# Patient Record
Sex: Female | Born: 1950 | Race: White | Hispanic: No | Marital: Married | Smoking: Never smoker
Health system: Southern US, Community
[De-identification: ages and names within clinical notes are randomized; demographics above are authoritative.]

## PROBLEM LIST (undated history)

## (undated) HISTORY — PX: AUGMENTATION MAMMAPLASTY: SUR837

---

## 2006-11-03 ENCOUNTER — Ambulatory Visit (HOSPITAL_COMMUNITY): Admission: RE | Admit: 2006-11-03 | Discharge: 2006-11-03 | Payer: Self-pay | Admitting: Unknown Physician Specialty

## 2007-10-28 ENCOUNTER — Ambulatory Visit: Payer: Self-pay | Admitting: Family Medicine

## 2007-10-28 DIAGNOSIS — M25559 Pain in unspecified hip: Secondary | ICD-10-CM | POA: Insufficient documentation

## 2007-11-16 ENCOUNTER — Ambulatory Visit: Payer: Self-pay | Admitting: Sports Medicine

## 2007-11-16 DIAGNOSIS — IMO0001 Reserved for inherently not codable concepts without codable children: Secondary | ICD-10-CM | POA: Insufficient documentation

## 2007-11-16 DIAGNOSIS — M6281 Muscle weakness (generalized): Secondary | ICD-10-CM | POA: Insufficient documentation

## 2007-11-16 LAB — CONVERTED CEMR LAB
AST: 20 units/L (ref 0–37)
Alkaline Phosphatase: 73 units/L (ref 39–117)
Anti Nuclear Antibody(ANA): NEGATIVE
BUN: 13 mg/dL (ref 6–23)
Creatinine, Ser: 0.65 mg/dL (ref 0.40–1.20)
Cyclic Citrullin Peptide Ab: 0.2 units (ref <7)
HCT: 37.8 % (ref 36.0–46.0)
MCV: 90.2 fL (ref 78.0–100.0)
Platelets: 258 10*3/uL (ref 150–400)
RDW: 12.8 % (ref 11.5–15.5)
Rhuematoid fact SerPl-aCnc: <20 intl units/mL (ref 0–20)
Total Bilirubin: 0.4 mg/dL (ref 0.3–1.2)

## 2007-11-30 ENCOUNTER — Telehealth: Payer: Self-pay | Admitting: *Deleted

## 2007-12-02 ENCOUNTER — Ambulatory Visit (HOSPITAL_COMMUNITY): Admission: RE | Admit: 2007-12-02 | Discharge: 2007-12-02 | Payer: Self-pay | Admitting: Unknown Physician Specialty

## 2008-12-12 ENCOUNTER — Ambulatory Visit (HOSPITAL_COMMUNITY): Admission: RE | Admit: 2008-12-12 | Discharge: 2008-12-12 | Payer: Self-pay | Admitting: Unknown Physician Specialty

## 2009-05-29 ENCOUNTER — Ambulatory Visit: Payer: Self-pay | Admitting: Vascular Surgery

## 2009-08-23 ENCOUNTER — Ambulatory Visit: Payer: Self-pay | Admitting: Vascular Surgery

## 2009-10-30 ENCOUNTER — Ambulatory Visit: Payer: Self-pay | Admitting: Vascular Surgery

## 2009-11-22 ENCOUNTER — Ambulatory Visit: Payer: Self-pay | Admitting: Vascular Surgery

## 2011-01-04 ENCOUNTER — Encounter: Payer: Self-pay | Admitting: Unknown Physician Specialty

## 2011-04-28 NOTE — Assessment & Plan Note (Signed)
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  This was on 10/30/09.  She has some very mild bruising, and  this is now completely resolved.  Her thrombectomy sites are difficult  to see, and she reports an improvement in the discomfort that she had  had.  There was an area of some clot in the tunnel under the skin on her  medial knee, and this appears to be resolving as well.   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  I could access  this but could not get a guidewire to pass secondary to spasm.   On imaging this with a hand-held SonoSite, she does continue to have  flow in the vein of Jacomini.   I discussed this with the patient.  I suspect that this will not be a  significant difficulty to her, but I explained that it may increase her  possibility for recurrence.  She will notify us should she develop any  new difficulty.   Larina Earthly, M.D.  Electronically Signed   TFE/MEDQ  D:  11/22/2009  T:  11/25/2009  Job:  [id number]

## 2011-04-28 NOTE — Assessment & Plan Note (Signed)
OFFICE VISIT   MARLET, KORTE  DOB:  02/20/1951                                       10/30/2009  CHART#:[id number]   The patient presents today for treatment of her left leg varicosities.  I had initially seen her in June of 2010.  She had progressive  discomfort over her left posterior thigh.  Ultrasound revealed a vein of  Giacomini in the posterior aspect of her thigh.  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  Today she  presented for the procedure.  She did have a proximally 7 cm segment of  Giacomini vein leading into her popliteal vein.  I accessed this but the  vein spasmed and I was unable to get a wire through this.  I elected not  to perform a cutdown on this small vein.  She did undergo what I  initially recommended which was stab phlebectomy of the tributary  varicosities in her posterior thigh, calf and pretibial area.  She did  quite well with this, was placed in a compression garment and will see  [place] again in 2-3 weeks for followup.  I discussed the inability to treat  her vein with laser and she understands.   Larina Earthly, M.D.  Electronically Signed   TFE/MEDQ  D:  10/30/2009  T:  10/31/2009  Job:  [id number]

## 2011-04-28 NOTE — Assessment & Plan Note (Signed)
OFFICE VISIT   Tami Diaz, Tami Diaz  DOB:  05-09-1951                                       08/23/2009  CHART#:[id number]   The patient presents today for continued followup of her left leg venous  pathology.  She is a very pleasant, active 60 year old female with  marked varicosities in her left leg.  These begin at her left posterior  thigh, extend through her popliteal space and on to her calf.  She has  worn compression garments for 3 months now and reports that she has not  had improvement in her symptoms.  She has discontinued running as  exercise due to leg pain.  She works as a Armed forces operational officer and has  periods of prolonged standing and sitting as part of her job and it is  difficult due to leg pain and also has grandchildren that she regularly  keeps and has to curtail physical activity with them secondary to leg  pain.   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  This is refluxing into the  area of tributary saphenous veins.  These are enlarged throughout her  posterior thigh, popliteal space and on to her calf.  This does not  appear to have any involvement with her small saphenous or great  saphenous vein.   I feel that her best treatment option would be laser ablation of the  vein of Giacomini.  It is straight enough and it not too distended for  ablation and then stab phlebectomy of her multiple tributary  varicosities extending from her posterior thigh down to the level of her  ankle.  She understands this and we will proceed once we have assured,  once we have obtained insurance coverage.   Larina Earthly, M.D.  Electronically Signed   TFE/MEDQ  D:  08/23/2009  T:  08/23/2009  Job:  [id number]   cc:   Doreen Beam, MD

## 2011-04-28 NOTE — Consult Note (Signed)
NEW PATIENT CONSULTATION   Tami Diaz, Tami Diaz  DOB:  [birth year]                                       05/29/2009  CHART#:[id number]   The patient presents today for evaluation of left leg venous pathology.  She is a very pleasant 60 year old white female with a progressive  enlargement and discomfort over veins around her left knee.  She reports  that these are worse with prolonged standing and does have sensation of  heat and irritation over these areas.  This is worse with prolonged  standing.  She does elevate her legs when possible.  She has not been  wearing compression garments recently.  She does have some swelling in  her calf as well.   PAST MEDICAL HISTORY:  Significant for multiple extensive sclerotherapy  sessions for large amounts of varicosities in the same region in 2001 at  an outlying vein center.  She eventually had closure of the veins in  question.  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  She did not have  any history of deep venous thrombosis.  Interestingly she was placed on  Coumadin around the time of the sclerotherapy.  She does not have any  history of deep venous thrombosis.   FAMILY HISTORY:  Significant for multiple members with venous  varicosities and thrombophlebitis.   SOCIAL HISTORY:  She is married, three children.  She works as a Dentist.  She does not smoke.  She does have one glass of wine several  days a week.   REVIEW OF SYSTEMS:  Her weight is reported at 130 pounds.  She is 5 feet  6 inches tall.  She has no cardiac, pulmonary, GI or GU symptoms.  She  does have arthritis.   ALLERGIES:  She has no known drug allergies.   CURRENT MEDICATIONS:  Prometrium and Estrasorb to help with osteopenia.   PHYSICAL EXAMINATION:  General:  A well-developed, well-nourished white  female who appears her stated age of 2.  Vital signs:  Blood pressure  is 100/72, pulse 52, respirations 18.  Her radial pulses are 2+.  Her  dorsalis pedis pulses are 2+ bilaterally.  She does not have any venous  varicosities in her right leg.  In the left leg she does have  varicosities beginning above her popliteal fossa extending down towards  her popliteal fossa and then on the medial aspect down to her pretibial  area.  She does not have any severe swelling currently.   She underwent venous duplex today and this does show vein of Giacomini  in the posterior calf and multiple tributary vessels arising off of  this.  I discussed options with the patient.  She has not worn  compression garments recently and we have fitted her today with 20-30  mmHg thigh high compression garments.  I instructed her on the use of  these.  I also explained that with no incompetence in her saphenous vein  I would not recommend any laser ablation consideration since this is not  the etiology of her reticular veins.  I have explained the option of  stab phlebectomy of these tributary varicosities in the outpatient  setting for relief of her symptoms should conservative treatment fail.  She will see me again in 3  months for continued discussion of this.   Larina Earthly, M.D.  Electronically Signed   TFE/MEDQ  D:  05/29/2009  T:  05/30/2009  Job:  [id number]

## 2011-04-28 NOTE — Procedures (Signed)
LOWER EXTREMITY VENOUS REFLUX EXAM   INDICATION:  Bulging veins in the left lower extremity.   EXAM:  Using color-flow imaging and pulse Doppler spectral analysis, the  left common femoral, superficial femoral, popliteal, posterior tibial,  greater and lesser saphenous veins were evaluated.  There is no evidence  suggesting deep venous insufficiency in the left common femoral vein.   The left saphenofemoral junction is competent.  The left GSV is not  competent with the caliber as described below.   The left proximal short saphenous vein demonstrates incompetency with  diameter measurements ranging from 0.2 to 0.63 cm.   GSV Diameter (used if found to be incompetent only)                                            Right    Left  Proximal Greater Saphenous Vein           cm       0.54 cm  Proximal-to-mid-thigh                     cm       0.44 cm  Mid thigh                                 cm       0.46 cm  Mid-distal thigh                          cm       0.39 cm  Distal thigh                              cm       0.39 cm  Knee                                      cm       0.31 cm   IMPRESSION:  1. Left greater saphenous vein reflux is identified with the caliber      measurements as described above.  2. The left greater saphenous vein is not aneurysmal or tortuous.  3. The left common femoral vein is not competent.  4. The left proximal lesser saphenous vein is not competent.   ___________________________________________  Larina Earthly, M.D.   CH/MEDQ  D:  05/29/2009  T:  05/29/2009  Job:  [id number]

## 2012-06-27 ENCOUNTER — Other Ambulatory Visit: Payer: Self-pay | Admitting: Plastic Surgery

## 2012-06-27 DIAGNOSIS — Z1231 Encounter for screening mammogram for malignant neoplasm of breast: Secondary | ICD-10-CM

## 2012-08-31 ENCOUNTER — Ambulatory Visit
Admission: RE | Admit: 2012-08-31 | Discharge: 2012-08-31 | Disposition: A | Discharge status: Home / Self Care | Payer: BC Managed Care – PPO | Source: Ambulatory Visit | Attending: Plastic Surgery | Admitting: Plastic Surgery

## 2012-08-31 DIAGNOSIS — Z1231 Encounter for screening mammogram for malignant neoplasm of breast: Secondary | ICD-10-CM

## 2013-09-22 ENCOUNTER — Other Ambulatory Visit: Payer: Self-pay

## 2013-09-22 DIAGNOSIS — Z1231 Encounter for screening mammogram for malignant neoplasm of breast: Secondary | ICD-10-CM

## 2013-12-28 ENCOUNTER — Ambulatory Visit
Admission: RE | Admit: 2013-12-28 | Discharge: 2013-12-28 | Disposition: A | Discharge status: Home / Self Care | Payer: BC Managed Care – PPO | Source: Ambulatory Visit

## 2013-12-28 DIAGNOSIS — Z1231 Encounter for screening mammogram for malignant neoplasm of breast: Secondary | ICD-10-CM

## 2016-01-14 ENCOUNTER — Other Ambulatory Visit: Payer: Self-pay

## 2016-01-14 DIAGNOSIS — Z1231 Encounter for screening mammogram for malignant neoplasm of breast: Secondary | ICD-10-CM

## 2016-01-31 ENCOUNTER — Ambulatory Visit: Payer: Self-pay

## 2016-02-14 ENCOUNTER — Ambulatory Visit
Admission: RE | Admit: 2016-02-14 | Discharge: 2016-02-14 | Disposition: A | Discharge status: Home / Self Care | Payer: BLUE CROSS/BLUE SHIELD | Source: Ambulatory Visit

## 2016-02-14 ENCOUNTER — Ambulatory Visit: Payer: Self-pay

## 2016-02-14 DIAGNOSIS — Z1231 Encounter for screening mammogram for malignant neoplasm of breast: Secondary | ICD-10-CM

## 2017-09-14 DIAGNOSIS — Z23 Encounter for immunization: Secondary | ICD-10-CM | POA: Diagnosis not present

## 2017-10-04 DIAGNOSIS — Z0000 Encounter for general adult medical examination without abnormal findings: Secondary | ICD-10-CM | POA: Diagnosis not present

## 2017-10-04 DIAGNOSIS — M8580 Other specified disorders of bone density and structure, unspecified site: Secondary | ICD-10-CM | POA: Diagnosis not present

## 2017-10-04 DIAGNOSIS — R5383 Other fatigue: Secondary | ICD-10-CM | POA: Diagnosis not present

## 2017-10-04 DIAGNOSIS — E2839 Other primary ovarian failure: Secondary | ICD-10-CM | POA: Diagnosis not present

## 2017-10-04 DIAGNOSIS — E559 Vitamin D deficiency, unspecified: Secondary | ICD-10-CM | POA: Diagnosis not present

## 2017-10-04 DIAGNOSIS — I8312 Varicose veins of left lower extremity with inflammation: Secondary | ICD-10-CM | POA: Diagnosis not present

## 2017-10-08 DIAGNOSIS — Z23 Encounter for immunization: Secondary | ICD-10-CM | POA: Diagnosis not present

## 2017-10-08 DIAGNOSIS — E559 Vitamin D deficiency, unspecified: Secondary | ICD-10-CM | POA: Diagnosis not present

## 2017-10-08 DIAGNOSIS — M19042 Primary osteoarthritis, left hand: Secondary | ICD-10-CM | POA: Diagnosis not present

## 2017-10-08 DIAGNOSIS — Z6821 Body mass index (BMI) 21.0-21.9, adult: Secondary | ICD-10-CM | POA: Diagnosis not present

## 2017-10-08 DIAGNOSIS — M8580 Other specified disorders of bone density and structure, unspecified site: Secondary | ICD-10-CM | POA: Diagnosis not present

## 2017-10-08 DIAGNOSIS — R001 Bradycardia, unspecified: Secondary | ICD-10-CM | POA: Diagnosis not present

## 2017-10-08 DIAGNOSIS — Z0000 Encounter for general adult medical examination without abnormal findings: Secondary | ICD-10-CM | POA: Diagnosis not present

## 2017-10-08 DIAGNOSIS — M19041 Primary osteoarthritis, right hand: Secondary | ICD-10-CM | POA: Diagnosis not present

## 2017-10-28 DIAGNOSIS — H2513 Age-related nuclear cataract, bilateral: Secondary | ICD-10-CM | POA: Diagnosis not present

## 2017-10-28 DIAGNOSIS — H00022 Hordeolum internum right lower eyelid: Secondary | ICD-10-CM | POA: Diagnosis not present

## 2017-10-28 DIAGNOSIS — H43313 Vitreous membranes and strands, bilateral: Secondary | ICD-10-CM | POA: Diagnosis not present

## 2017-11-11 DIAGNOSIS — Z780 Asymptomatic menopausal state: Secondary | ICD-10-CM | POA: Diagnosis not present

## 2017-11-11 DIAGNOSIS — M8589 Other specified disorders of bone density and structure, multiple sites: Secondary | ICD-10-CM | POA: Diagnosis not present

## 2017-11-11 DIAGNOSIS — M810 Age-related osteoporosis without current pathological fracture: Secondary | ICD-10-CM | POA: Diagnosis not present

## 2017-11-12 DIAGNOSIS — L570 Actinic keratosis: Secondary | ICD-10-CM | POA: Diagnosis not present

## 2017-11-12 DIAGNOSIS — D229 Melanocytic nevi, unspecified: Secondary | ICD-10-CM | POA: Diagnosis not present

## 2017-11-12 DIAGNOSIS — L821 Other seborrheic keratosis: Secondary | ICD-10-CM | POA: Diagnosis not present

## 2018-01-05 ENCOUNTER — Other Ambulatory Visit: Payer: Self-pay | Admitting: Family Medicine

## 2018-01-05 DIAGNOSIS — Z139 Encounter for screening, unspecified: Secondary | ICD-10-CM

## 2018-01-28 ENCOUNTER — Ambulatory Visit
Admission: RE | Admit: 2018-01-28 | Discharge: 2018-01-28 | Disposition: A | Discharge status: Home / Self Care | Payer: Medicare Other | Source: Ambulatory Visit | Attending: Family Medicine | Admitting: Family Medicine

## 2018-01-28 DIAGNOSIS — Z1231 Encounter for screening mammogram for malignant neoplasm of breast: Secondary | ICD-10-CM | POA: Diagnosis not present

## 2018-01-28 DIAGNOSIS — Z139 Encounter for screening, unspecified: Secondary | ICD-10-CM

## 2018-07-12 DIAGNOSIS — Z6821 Body mass index (BMI) 21.0-21.9, adult: Secondary | ICD-10-CM | POA: Diagnosis not present

## 2018-07-12 DIAGNOSIS — J209 Acute bronchitis, unspecified: Secondary | ICD-10-CM | POA: Diagnosis not present

## 2018-07-12 DIAGNOSIS — R05 Cough: Secondary | ICD-10-CM | POA: Diagnosis not present

## 2018-09-20 DIAGNOSIS — Z23 Encounter for immunization: Secondary | ICD-10-CM | POA: Diagnosis not present

## 2018-10-11 DIAGNOSIS — E559 Vitamin D deficiency, unspecified: Secondary | ICD-10-CM | POA: Diagnosis not present

## 2018-10-11 DIAGNOSIS — R5383 Other fatigue: Secondary | ICD-10-CM | POA: Diagnosis not present

## 2018-10-11 DIAGNOSIS — Z8349 Family history of other endocrine, nutritional and metabolic diseases: Secondary | ICD-10-CM | POA: Diagnosis not present

## 2018-10-11 DIAGNOSIS — M8580 Other specified disorders of bone density and structure, unspecified site: Secondary | ICD-10-CM | POA: Diagnosis not present

## 2018-10-11 DIAGNOSIS — Z13220 Encounter for screening for lipoid disorders: Secondary | ICD-10-CM | POA: Diagnosis not present

## 2018-10-13 DIAGNOSIS — Z23 Encounter for immunization: Secondary | ICD-10-CM | POA: Diagnosis not present

## 2018-10-13 DIAGNOSIS — Z6821 Body mass index (BMI) 21.0-21.9, adult: Secondary | ICD-10-CM | POA: Diagnosis not present

## 2018-10-13 DIAGNOSIS — Z0001 Encounter for general adult medical examination with abnormal findings: Secondary | ICD-10-CM | POA: Diagnosis not present

## 2018-11-23 DIAGNOSIS — H43313 Vitreous membranes and strands, bilateral: Secondary | ICD-10-CM | POA: Diagnosis not present

## 2018-11-23 DIAGNOSIS — H2513 Age-related nuclear cataract, bilateral: Secondary | ICD-10-CM | POA: Diagnosis not present

## 2018-11-30 DIAGNOSIS — D229 Melanocytic nevi, unspecified: Secondary | ICD-10-CM | POA: Diagnosis not present

## 2018-11-30 DIAGNOSIS — L570 Actinic keratosis: Secondary | ICD-10-CM | POA: Diagnosis not present

## 2019-01-25 DIAGNOSIS — L570 Actinic keratosis: Secondary | ICD-10-CM | POA: Diagnosis not present

## 2019-08-25 DIAGNOSIS — Z6821 Body mass index (BMI) 21.0-21.9, adult: Secondary | ICD-10-CM | POA: Diagnosis not present

## 2019-08-25 DIAGNOSIS — R319 Hematuria, unspecified: Secondary | ICD-10-CM | POA: Diagnosis not present

## 2019-09-01 DIAGNOSIS — R1909 Other intra-abdominal and pelvic swelling, mass and lump: Secondary | ICD-10-CM | POA: Diagnosis not present

## 2019-09-01 DIAGNOSIS — R319 Hematuria, unspecified: Secondary | ICD-10-CM | POA: Diagnosis not present

## 2019-09-11 DIAGNOSIS — N838 Other noninflammatory disorders of ovary, fallopian tube and broad ligament: Secondary | ICD-10-CM | POA: Diagnosis not present

## 2019-09-11 DIAGNOSIS — N9489 Other specified conditions associated with female genital organs and menstrual cycle: Secondary | ICD-10-CM | POA: Diagnosis not present

## 2019-09-11 DIAGNOSIS — R1909 Other intra-abdominal and pelvic swelling, mass and lump: Secondary | ICD-10-CM | POA: Diagnosis not present

## 2019-09-13 ENCOUNTER — Other Ambulatory Visit: Payer: Self-pay | Admitting: Family Medicine

## 2019-09-13 DIAGNOSIS — Z1231 Encounter for screening mammogram for malignant neoplasm of breast: Secondary | ICD-10-CM

## 2019-09-27 DIAGNOSIS — N83202 Unspecified ovarian cyst, left side: Secondary | ICD-10-CM | POA: Diagnosis not present

## 2019-09-29 DIAGNOSIS — Z01818 Encounter for other preprocedural examination: Secondary | ICD-10-CM | POA: Diagnosis not present

## 2019-09-29 DIAGNOSIS — N83202 Unspecified ovarian cyst, left side: Secondary | ICD-10-CM | POA: Diagnosis not present

## 2019-10-02 DIAGNOSIS — N83292 Other ovarian cyst, left side: Secondary | ICD-10-CM | POA: Diagnosis not present

## 2019-10-02 DIAGNOSIS — N83202 Unspecified ovarian cyst, left side: Secondary | ICD-10-CM | POA: Diagnosis not present

## 2019-10-09 DIAGNOSIS — H43392 Other vitreous opacities, left eye: Secondary | ICD-10-CM | POA: Diagnosis not present

## 2019-10-09 DIAGNOSIS — H2513 Age-related nuclear cataract, bilateral: Secondary | ICD-10-CM | POA: Diagnosis not present

## 2019-10-13 DIAGNOSIS — R001 Bradycardia, unspecified: Secondary | ICD-10-CM | POA: Diagnosis not present

## 2019-10-13 DIAGNOSIS — E559 Vitamin D deficiency, unspecified: Secondary | ICD-10-CM | POA: Diagnosis not present

## 2019-10-13 DIAGNOSIS — R5383 Other fatigue: Secondary | ICD-10-CM | POA: Diagnosis not present

## 2019-10-13 DIAGNOSIS — Z13220 Encounter for screening for lipoid disorders: Secondary | ICD-10-CM | POA: Diagnosis not present

## 2019-10-16 DIAGNOSIS — Z23 Encounter for immunization: Secondary | ICD-10-CM | POA: Diagnosis not present

## 2019-10-16 DIAGNOSIS — M818 Other osteoporosis without current pathological fracture: Secondary | ICD-10-CM | POA: Diagnosis not present

## 2019-10-16 DIAGNOSIS — N838 Other noninflammatory disorders of ovary, fallopian tube and broad ligament: Secondary | ICD-10-CM | POA: Diagnosis not present

## 2019-10-16 DIAGNOSIS — Z6821 Body mass index (BMI) 21.0-21.9, adult: Secondary | ICD-10-CM | POA: Diagnosis not present

## 2019-10-16 DIAGNOSIS — Z0001 Encounter for general adult medical examination with abnormal findings: Secondary | ICD-10-CM | POA: Diagnosis not present

## 2019-10-26 DIAGNOSIS — Z1212 Encounter for screening for malignant neoplasm of rectum: Secondary | ICD-10-CM | POA: Diagnosis not present

## 2019-10-26 DIAGNOSIS — Z1211 Encounter for screening for malignant neoplasm of colon: Secondary | ICD-10-CM | POA: Diagnosis not present

## 2019-10-31 ENCOUNTER — Ambulatory Visit
Admission: RE | Admit: 2019-10-31 | Discharge: 2019-10-31 | Disposition: A | Discharge status: Home / Self Care | Payer: Medicare Other | Source: Ambulatory Visit | Attending: Family Medicine | Admitting: Family Medicine

## 2019-10-31 ENCOUNTER — Other Ambulatory Visit: Payer: Self-pay

## 2019-10-31 DIAGNOSIS — Z1231 Encounter for screening mammogram for malignant neoplasm of breast: Secondary | ICD-10-CM | POA: Diagnosis not present

## 2019-12-23 DIAGNOSIS — Z23 Encounter for immunization: Secondary | ICD-10-CM | POA: Diagnosis not present

## 2020-01-20 DIAGNOSIS — Z23 Encounter for immunization: Secondary | ICD-10-CM | POA: Diagnosis not present

## 2020-02-29 DIAGNOSIS — M810 Age-related osteoporosis without current pathological fracture: Secondary | ICD-10-CM | POA: Diagnosis not present

## 2020-02-29 DIAGNOSIS — N951 Menopausal and female climacteric states: Secondary | ICD-10-CM | POA: Diagnosis not present

## 2020-02-29 DIAGNOSIS — M859 Disorder of bone density and structure, unspecified: Secondary | ICD-10-CM | POA: Diagnosis not present

## 2020-02-29 DIAGNOSIS — Z23 Encounter for immunization: Secondary | ICD-10-CM | POA: Diagnosis not present

## 2020-03-26 DIAGNOSIS — R102 Pelvic and perineal pain: Secondary | ICD-10-CM | POA: Diagnosis not present

## 2020-04-02 DIAGNOSIS — R1032 Left lower quadrant pain: Secondary | ICD-10-CM | POA: Diagnosis not present

## 2020-04-02 DIAGNOSIS — R102 Pelvic and perineal pain: Secondary | ICD-10-CM | POA: Diagnosis not present

## 2020-06-26 DIAGNOSIS — I8312 Varicose veins of left lower extremity with inflammation: Secondary | ICD-10-CM | POA: Diagnosis not present

## 2020-07-11 DIAGNOSIS — I8312 Varicose veins of left lower extremity with inflammation: Secondary | ICD-10-CM | POA: Diagnosis not present

## 2020-07-22 DIAGNOSIS — S233XXA Sprain of ligaments of thoracic spine, initial encounter: Secondary | ICD-10-CM | POA: Diagnosis not present

## 2020-07-22 DIAGNOSIS — S338XXA Sprain of other parts of lumbar spine and pelvis, initial encounter: Secondary | ICD-10-CM | POA: Diagnosis not present

## 2020-07-22 DIAGNOSIS — M47816 Spondylosis without myelopathy or radiculopathy, lumbar region: Secondary | ICD-10-CM | POA: Diagnosis not present

## 2020-07-22 DIAGNOSIS — M9903 Segmental and somatic dysfunction of lumbar region: Secondary | ICD-10-CM | POA: Diagnosis not present

## 2020-07-23 DIAGNOSIS — I8312 Varicose veins of left lower extremity with inflammation: Secondary | ICD-10-CM | POA: Diagnosis not present

## 2020-08-13 ENCOUNTER — Other Ambulatory Visit: Payer: Self-pay

## 2020-08-13 ENCOUNTER — Encounter: Payer: Self-pay | Admitting: Physician Assistant

## 2020-08-13 ENCOUNTER — Ambulatory Visit (INDEPENDENT_AMBULATORY_CARE_PROVIDER_SITE_OTHER): Payer: Medicare Other | Admitting: Physician Assistant

## 2020-08-13 DIAGNOSIS — L814 Other melanin hyperpigmentation: Secondary | ICD-10-CM | POA: Diagnosis not present

## 2020-08-13 DIAGNOSIS — L578 Other skin changes due to chronic exposure to nonionizing radiation: Secondary | ICD-10-CM | POA: Diagnosis not present

## 2020-08-13 DIAGNOSIS — D229 Melanocytic nevi, unspecified: Secondary | ICD-10-CM | POA: Diagnosis not present

## 2020-08-13 DIAGNOSIS — L821 Other seborrheic keratosis: Secondary | ICD-10-CM

## 2020-08-13 DIAGNOSIS — Z1283 Encounter for screening for malignant neoplasm of skin: Secondary | ICD-10-CM | POA: Diagnosis not present

## 2020-08-13 DIAGNOSIS — D1800 Hemangioma unspecified site: Secondary | ICD-10-CM

## 2020-08-13 MED ORDER — FLUOROURACIL 5 % EX CREA: TOPICAL_CREAM | Freq: Every day | CUTANEOUS | 1 refills | Status: AC

## 2020-08-13 NOTE — Progress Notes (Addendum)
   Follow-Up Visit   Subjective  Tami Diaz is a 69 y.o. female who presents for the following: Annual Exam (skin check--spot on left arm, noticted for a few months).   The following portions of the chart were reviewed this encounter and updated as appropriate: Tobacco  Allergies  Meds  Problems  Med Hx  Surg Hx  Fam Hx      Objective  Well appearing patient in no apparent distress; mood and affect are within normal limits.  A full examination was performed including scalp, head, eyes, ears, nose, lips, neck, chest, axillae, abdomen, back, buttocks, bilateral upper extremities, bilateral lower extremities, hands, feet, fingers, toes, fingernails, and toenails. All findings within normal limits unless otherwise noted below.  Objective  Head - Anterior (Face): Diffuse pink scales  Objective  head to toe: No atypical nevi No signs of non-mole skin cancer.   Objective  Left superior forearm: Stuck-on, waxy, tan-brown plaques. --Discussed benign etiology and prognosis.   Assessment & Plan  Actinic skin damage Head - Anterior (Face)  fluorouracil (EFUDEX) 5 % cream - Head - Anterior (Face)  Screening exam for skin cancer head to toe  Skin examinations  Seborrheic keratosis Left superior forearm  observe Lentigines - Scattered tan macules - Discussed due to sun exposure - Benign, observe - Call for any changes  Seborrheic Keratoses - Stuck-on, waxy, tan-brown papules and plaques  - Discussed benign etiology and prognosis. - Observe - Call for any changes  Melanocytic Nevi - Tan-brown and/or pink-flesh-colored symmetric macules and papules - Benign appearing on exam today - Observation - Call clinic for new or changing moles - Recommend daily use of broad spectrum spf 30+ sunscreen to sun-exposed areas.   Hemangiomas - Red papules - Discussed benign nature - Observe - Call for any changes  Actinic Damage - diffuse scaly erythematous macules with  underlying dyspigmentation - Recommend daily broad spectrum sunscreen SPF 30+ to sun-exposed areas, reapply every 2 hours as needed.  - Call for new or changing lesions.    I, Ardie Mclennan, PA-C, have reviewed all documentation's for this visit.  The documentation on 08/13/20 for the exam, diagnosis, procedures and orders are all accurate and complete.

## 2020-08-14 ENCOUNTER — Telehealth: Payer: Self-pay | Admitting: *Deleted

## 2020-08-14 NOTE — Telephone Encounter (Signed)
Prior authorization done via cover my meds   Tami Diaz (Key: [id number])  This request has been approved.  Please note any additional information provided by [employer] Part D at the bottom of your screen.

## 2020-08-20 ENCOUNTER — Ambulatory Visit: Payer: Medicare Other | Admitting: Physician Assistant

## 2020-09-03 DIAGNOSIS — I8312 Varicose veins of left lower extremity with inflammation: Secondary | ICD-10-CM | POA: Diagnosis not present

## 2020-09-20 DIAGNOSIS — I8312 Varicose veins of left lower extremity with inflammation: Secondary | ICD-10-CM | POA: Diagnosis not present

## 2020-09-24 DIAGNOSIS — H2513 Age-related nuclear cataract, bilateral: Secondary | ICD-10-CM | POA: Diagnosis not present

## 2020-10-06 DIAGNOSIS — Z23 Encounter for immunization: Secondary | ICD-10-CM | POA: Diagnosis not present

## 2020-10-15 DIAGNOSIS — E782 Mixed hyperlipidemia: Secondary | ICD-10-CM | POA: Diagnosis not present

## 2020-10-15 DIAGNOSIS — Z13220 Encounter for screening for lipoid disorders: Secondary | ICD-10-CM | POA: Diagnosis not present

## 2020-10-15 DIAGNOSIS — E559 Vitamin D deficiency, unspecified: Secondary | ICD-10-CM | POA: Diagnosis not present

## 2020-10-15 DIAGNOSIS — R5383 Other fatigue: Secondary | ICD-10-CM | POA: Diagnosis not present

## 2020-10-15 DIAGNOSIS — Z1329 Encounter for screening for other suspected endocrine disorder: Secondary | ICD-10-CM | POA: Diagnosis not present

## 2020-10-23 ENCOUNTER — Other Ambulatory Visit: Payer: Self-pay | Admitting: Family Medicine

## 2020-10-23 DIAGNOSIS — Z1231 Encounter for screening mammogram for malignant neoplasm of breast: Secondary | ICD-10-CM

## 2020-10-23 DIAGNOSIS — Z23 Encounter for immunization: Secondary | ICD-10-CM | POA: Diagnosis not present

## 2020-10-23 DIAGNOSIS — M19042 Primary osteoarthritis, left hand: Secondary | ICD-10-CM | POA: Diagnosis not present

## 2020-10-23 DIAGNOSIS — E559 Vitamin D deficiency, unspecified: Secondary | ICD-10-CM | POA: Diagnosis not present

## 2020-10-23 DIAGNOSIS — Z0001 Encounter for general adult medical examination with abnormal findings: Secondary | ICD-10-CM | POA: Diagnosis not present

## 2020-10-23 DIAGNOSIS — R001 Bradycardia, unspecified: Secondary | ICD-10-CM | POA: Diagnosis not present

## 2020-10-23 DIAGNOSIS — M8580 Other specified disorders of bone density and structure, unspecified site: Secondary | ICD-10-CM | POA: Diagnosis not present

## 2020-10-23 DIAGNOSIS — Z6821 Body mass index (BMI) 21.0-21.9, adult: Secondary | ICD-10-CM | POA: Diagnosis not present

## 2020-11-22 DIAGNOSIS — I8312 Varicose veins of left lower extremity with inflammation: Secondary | ICD-10-CM | POA: Diagnosis not present

## 2020-12-04 ENCOUNTER — Ambulatory Visit
Admission: RE | Admit: 2020-12-04 | Discharge: 2020-12-04 | Disposition: A | Discharge status: Home / Self Care | Payer: Medicare Other | Source: Ambulatory Visit | Attending: Family Medicine | Admitting: Family Medicine

## 2020-12-04 ENCOUNTER — Other Ambulatory Visit: Payer: Self-pay

## 2020-12-04 DIAGNOSIS — Z1231 Encounter for screening mammogram for malignant neoplasm of breast: Secondary | ICD-10-CM

## 2020-12-20 DIAGNOSIS — I8312 Varicose veins of left lower extremity with inflammation: Secondary | ICD-10-CM | POA: Diagnosis not present

## 2021-01-14 DIAGNOSIS — M7981 Nontraumatic hematoma of soft tissue: Secondary | ICD-10-CM | POA: Diagnosis not present

## 2021-01-14 DIAGNOSIS — I8312 Varicose veins of left lower extremity with inflammation: Secondary | ICD-10-CM | POA: Diagnosis not present

## 2021-01-29 DIAGNOSIS — M7981 Nontraumatic hematoma of soft tissue: Secondary | ICD-10-CM | POA: Diagnosis not present

## 2021-01-29 DIAGNOSIS — I8312 Varicose veins of left lower extremity with inflammation: Secondary | ICD-10-CM | POA: Diagnosis not present

## 2021-04-11 DIAGNOSIS — Z23 Encounter for immunization: Secondary | ICD-10-CM | POA: Diagnosis not present

## 2021-06-24 DIAGNOSIS — H9313 Tinnitus, bilateral: Secondary | ICD-10-CM | POA: Diagnosis not present

## 2021-06-24 DIAGNOSIS — H6993 Unspecified Eustachian tube disorder, bilateral: Secondary | ICD-10-CM | POA: Diagnosis not present

## 2021-07-17 DIAGNOSIS — H9313 Tinnitus, bilateral: Secondary | ICD-10-CM | POA: Diagnosis not present

## 2021-07-17 DIAGNOSIS — H903 Sensorineural hearing loss, bilateral: Secondary | ICD-10-CM | POA: Diagnosis not present

## 2021-07-17 DIAGNOSIS — H6993 Unspecified Eustachian tube disorder, bilateral: Secondary | ICD-10-CM | POA: Diagnosis not present

## 2021-08-11 DIAGNOSIS — Z1283 Encounter for screening for malignant neoplasm of skin: Secondary | ICD-10-CM | POA: Diagnosis not present

## 2021-08-11 DIAGNOSIS — D239 Other benign neoplasm of skin, unspecified: Secondary | ICD-10-CM | POA: Diagnosis not present

## 2021-08-11 DIAGNOSIS — L570 Actinic keratosis: Secondary | ICD-10-CM | POA: Diagnosis not present

## 2021-08-31 DIAGNOSIS — U071 COVID-19: Secondary | ICD-10-CM | POA: Diagnosis not present

## 2021-09-24 DIAGNOSIS — H43393 Other vitreous opacities, bilateral: Secondary | ICD-10-CM | POA: Diagnosis not present

## 2021-09-24 DIAGNOSIS — H2513 Age-related nuclear cataract, bilateral: Secondary | ICD-10-CM | POA: Diagnosis not present

## 2021-09-24 DIAGNOSIS — H532 Diplopia: Secondary | ICD-10-CM | POA: Diagnosis not present

## 2021-09-30 DIAGNOSIS — Z23 Encounter for immunization: Secondary | ICD-10-CM | POA: Diagnosis not present

## 2021-10-13 DIAGNOSIS — R001 Bradycardia, unspecified: Secondary | ICD-10-CM | POA: Diagnosis not present

## 2021-10-13 DIAGNOSIS — M8580 Other specified disorders of bone density and structure, unspecified site: Secondary | ICD-10-CM | POA: Diagnosis not present

## 2021-10-21 DIAGNOSIS — E785 Hyperlipidemia, unspecified: Secondary | ICD-10-CM | POA: Diagnosis not present

## 2021-10-21 DIAGNOSIS — Z1329 Encounter for screening for other suspected endocrine disorder: Secondary | ICD-10-CM | POA: Diagnosis not present

## 2021-10-21 DIAGNOSIS — Z13220 Encounter for screening for lipoid disorders: Secondary | ICD-10-CM | POA: Diagnosis not present

## 2021-10-21 DIAGNOSIS — E559 Vitamin D deficiency, unspecified: Secondary | ICD-10-CM | POA: Diagnosis not present

## 2021-10-21 DIAGNOSIS — R5383 Other fatigue: Secondary | ICD-10-CM | POA: Diagnosis not present

## 2021-10-27 ENCOUNTER — Other Ambulatory Visit: Payer: Self-pay | Admitting: Family Medicine

## 2021-10-27 DIAGNOSIS — Z6821 Body mass index (BMI) 21.0-21.9, adult: Secondary | ICD-10-CM | POA: Diagnosis not present

## 2021-10-27 DIAGNOSIS — Z0001 Encounter for general adult medical examination with abnormal findings: Secondary | ICD-10-CM | POA: Diagnosis not present

## 2021-10-27 DIAGNOSIS — N838 Other noninflammatory disorders of ovary, fallopian tube and broad ligament: Secondary | ICD-10-CM | POA: Diagnosis not present

## 2021-10-27 DIAGNOSIS — R001 Bradycardia, unspecified: Secondary | ICD-10-CM | POA: Diagnosis not present

## 2021-10-27 DIAGNOSIS — U071 COVID-19: Secondary | ICD-10-CM | POA: Diagnosis not present

## 2021-10-27 DIAGNOSIS — M8580 Other specified disorders of bone density and structure, unspecified site: Secondary | ICD-10-CM | POA: Diagnosis not present

## 2021-10-27 DIAGNOSIS — Z1231 Encounter for screening mammogram for malignant neoplasm of breast: Secondary | ICD-10-CM

## 2021-12-09 ENCOUNTER — Ambulatory Visit
Admission: RE | Admit: 2021-12-09 | Discharge: 2021-12-09 | Disposition: A | Discharge status: Home / Self Care | Payer: Medicare Other | Source: Ambulatory Visit | Attending: Family Medicine | Admitting: Family Medicine

## 2021-12-09 DIAGNOSIS — Z1231 Encounter for screening mammogram for malignant neoplasm of breast: Secondary | ICD-10-CM | POA: Diagnosis not present

## 2021-12-11 ENCOUNTER — Ambulatory Visit: Payer: Medicare Other

## 2021-12-11 ENCOUNTER — Other Ambulatory Visit: Payer: Self-pay | Admitting: Family Medicine

## 2021-12-11 DIAGNOSIS — R928 Other abnormal and inconclusive findings on diagnostic imaging of breast: Secondary | ICD-10-CM

## 2021-12-23 ENCOUNTER — Other Ambulatory Visit: Payer: Self-pay | Admitting: Family Medicine

## 2021-12-23 ENCOUNTER — Other Ambulatory Visit: Payer: Self-pay

## 2021-12-23 ENCOUNTER — Ambulatory Visit
Admission: RE | Admit: 2021-12-23 | Discharge: 2021-12-23 | Disposition: A | Discharge status: Home / Self Care | Payer: Medicare Other | Source: Ambulatory Visit | Attending: Family Medicine | Admitting: Family Medicine

## 2021-12-23 DIAGNOSIS — R921 Mammographic calcification found on diagnostic imaging of breast: Secondary | ICD-10-CM

## 2021-12-23 DIAGNOSIS — R928 Other abnormal and inconclusive findings on diagnostic imaging of breast: Secondary | ICD-10-CM

## 2021-12-31 DIAGNOSIS — Z01419 Encounter for gynecological examination (general) (routine) without abnormal findings: Secondary | ICD-10-CM | POA: Diagnosis not present

## 2022-01-02 ENCOUNTER — Ambulatory Visit
Admission: RE | Admit: 2022-01-02 | Discharge: 2022-01-02 | Disposition: A | Discharge status: Home / Self Care | Payer: Medicare Other | Source: Ambulatory Visit | Attending: Family Medicine | Admitting: Family Medicine

## 2022-01-02 ENCOUNTER — Other Ambulatory Visit: Payer: Self-pay | Admitting: Family Medicine

## 2022-01-02 ENCOUNTER — Other Ambulatory Visit: Payer: Self-pay

## 2022-01-02 DIAGNOSIS — R928 Other abnormal and inconclusive findings on diagnostic imaging of breast: Secondary | ICD-10-CM

## 2022-01-02 DIAGNOSIS — R921 Mammographic calcification found on diagnostic imaging of breast: Secondary | ICD-10-CM

## 2022-03-04 DIAGNOSIS — U071 COVID-19: Secondary | ICD-10-CM | POA: Diagnosis not present

## 2022-07-03 ENCOUNTER — Ambulatory Visit
Admission: RE | Admit: 2022-07-03 | Discharge: 2022-07-03 | Disposition: A | Discharge status: Home / Self Care | Payer: Medicare Other | Source: Ambulatory Visit | Attending: Family Medicine | Admitting: Family Medicine

## 2022-07-03 DIAGNOSIS — R921 Mammographic calcification found on diagnostic imaging of breast: Secondary | ICD-10-CM

## 2022-08-12 DIAGNOSIS — D239 Other benign neoplasm of skin, unspecified: Secondary | ICD-10-CM | POA: Diagnosis not present

## 2022-08-12 DIAGNOSIS — Z1283 Encounter for screening for malignant neoplasm of skin: Secondary | ICD-10-CM | POA: Diagnosis not present

## 2022-08-12 DIAGNOSIS — L570 Actinic keratosis: Secondary | ICD-10-CM | POA: Diagnosis not present

## 2022-10-27 DIAGNOSIS — Z1159 Encounter for screening for other viral diseases: Secondary | ICD-10-CM | POA: Diagnosis not present

## 2022-10-27 DIAGNOSIS — Z1329 Encounter for screening for other suspected endocrine disorder: Secondary | ICD-10-CM | POA: Diagnosis not present

## 2022-10-27 DIAGNOSIS — R5383 Other fatigue: Secondary | ICD-10-CM | POA: Diagnosis not present

## 2022-10-27 DIAGNOSIS — E559 Vitamin D deficiency, unspecified: Secondary | ICD-10-CM | POA: Diagnosis not present

## 2022-10-27 DIAGNOSIS — Z13220 Encounter for screening for lipoid disorders: Secondary | ICD-10-CM | POA: Diagnosis not present

## 2022-11-02 DIAGNOSIS — Z23 Encounter for immunization: Secondary | ICD-10-CM | POA: Diagnosis not present

## 2022-11-02 DIAGNOSIS — Z1389 Encounter for screening for other disorder: Secondary | ICD-10-CM | POA: Diagnosis not present

## 2022-11-02 DIAGNOSIS — Z0001 Encounter for general adult medical examination with abnormal findings: Secondary | ICD-10-CM | POA: Diagnosis not present

## 2022-11-02 DIAGNOSIS — R001 Bradycardia, unspecified: Secondary | ICD-10-CM | POA: Diagnosis not present

## 2022-11-02 DIAGNOSIS — R030 Elevated blood-pressure reading, without diagnosis of hypertension: Secondary | ICD-10-CM | POA: Diagnosis not present

## 2022-11-02 DIAGNOSIS — Z6821 Body mass index (BMI) 21.0-21.9, adult: Secondary | ICD-10-CM | POA: Diagnosis not present

## 2022-11-02 DIAGNOSIS — R921 Mammographic calcification found on diagnostic imaging of breast: Secondary | ICD-10-CM | POA: Diagnosis not present

## 2022-11-02 DIAGNOSIS — R42 Dizziness and giddiness: Secondary | ICD-10-CM | POA: Diagnosis not present

## 2022-11-02 DIAGNOSIS — Z1331 Encounter for screening for depression: Secondary | ICD-10-CM | POA: Diagnosis not present

## 2022-11-02 DIAGNOSIS — M8580 Other specified disorders of bone density and structure, unspecified site: Secondary | ICD-10-CM | POA: Diagnosis not present

## 2022-11-09 DIAGNOSIS — Z1211 Encounter for screening for malignant neoplasm of colon: Secondary | ICD-10-CM | POA: Diagnosis not present

## 2022-11-18 LAB — COLOGUARD: COLOGUARD: NEGATIVE

## 2022-11-18 LAB — EXTERNAL GENERIC LAB PROCEDURE: COLOGUARD: NEGATIVE

## 2022-11-20 DIAGNOSIS — H43393 Other vitreous opacities, bilateral: Secondary | ICD-10-CM | POA: Diagnosis not present

## 2022-11-26 ENCOUNTER — Other Ambulatory Visit: Payer: Self-pay | Admitting: Family Medicine

## 2022-11-26 DIAGNOSIS — Z09 Encounter for follow-up examination after completed treatment for conditions other than malignant neoplasm: Secondary | ICD-10-CM

## 2022-11-26 DIAGNOSIS — R921 Mammographic calcification found on diagnostic imaging of breast: Secondary | ICD-10-CM

## 2022-12-02 DIAGNOSIS — M810 Age-related osteoporosis without current pathological fracture: Secondary | ICD-10-CM | POA: Diagnosis not present

## 2022-12-15 ENCOUNTER — Ambulatory Visit
Admission: RE | Admit: 2022-12-15 | Discharge: 2022-12-15 | Disposition: A | Discharge status: Home / Self Care | Payer: Medicare Other | Source: Ambulatory Visit | Attending: Family Medicine | Admitting: Family Medicine

## 2022-12-15 DIAGNOSIS — R921 Mammographic calcification found on diagnostic imaging of breast: Secondary | ICD-10-CM

## 2023-02-16 DIAGNOSIS — Z8742 Personal history of other diseases of the female genital tract: Secondary | ICD-10-CM | POA: Diagnosis not present

## 2023-05-26 DIAGNOSIS — M5441 Lumbago with sciatica, right side: Secondary | ICD-10-CM | POA: Diagnosis not present

## 2023-05-26 DIAGNOSIS — S338XXA Sprain of other parts of lumbar spine and pelvis, initial encounter: Secondary | ICD-10-CM | POA: Diagnosis not present

## 2023-05-26 DIAGNOSIS — M9903 Segmental and somatic dysfunction of lumbar region: Secondary | ICD-10-CM | POA: Diagnosis not present

## 2023-06-22 DIAGNOSIS — M5441 Lumbago with sciatica, right side: Secondary | ICD-10-CM | POA: Diagnosis not present

## 2023-06-22 DIAGNOSIS — S338XXA Sprain of other parts of lumbar spine and pelvis, initial encounter: Secondary | ICD-10-CM | POA: Diagnosis not present

## 2023-06-22 DIAGNOSIS — M9903 Segmental and somatic dysfunction of lumbar region: Secondary | ICD-10-CM | POA: Diagnosis not present

## 2023-06-28 DIAGNOSIS — M5441 Lumbago with sciatica, right side: Secondary | ICD-10-CM | POA: Diagnosis not present

## 2023-06-28 DIAGNOSIS — M9903 Segmental and somatic dysfunction of lumbar region: Secondary | ICD-10-CM | POA: Diagnosis not present

## 2023-06-28 DIAGNOSIS — S338XXA Sprain of other parts of lumbar spine and pelvis, initial encounter: Secondary | ICD-10-CM | POA: Diagnosis not present

## 2023-07-01 DIAGNOSIS — M9903 Segmental and somatic dysfunction of lumbar region: Secondary | ICD-10-CM | POA: Diagnosis not present

## 2023-07-01 DIAGNOSIS — M5441 Lumbago with sciatica, right side: Secondary | ICD-10-CM | POA: Diagnosis not present

## 2023-07-01 DIAGNOSIS — S338XXA Sprain of other parts of lumbar spine and pelvis, initial encounter: Secondary | ICD-10-CM | POA: Diagnosis not present

## 2023-07-29 DIAGNOSIS — Z1283 Encounter for screening for malignant neoplasm of skin: Secondary | ICD-10-CM | POA: Diagnosis not present

## 2023-07-29 DIAGNOSIS — D485 Neoplasm of uncertain behavior of skin: Secondary | ICD-10-CM | POA: Diagnosis not present

## 2023-07-29 DIAGNOSIS — L570 Actinic keratosis: Secondary | ICD-10-CM | POA: Diagnosis not present

## 2023-08-01 IMAGING — MG MM DIGITAL DIAGNOSTIC UNILAT*L* W/ IMPLANTS
3 series · 3 of 3 positions shown · non-contrast
Comparison: Previous exam(s).
COMPARISON: Previous exam(s).

Addendum:
CLINICAL DATA: The patient was called back from screening
mammography due to new left breast calcifications.

EXAM:
DIGITAL DIAGNOSTIC UNILATERAL LEFT MAMMOGRAM WITH IMPLANTS
TECHNIQUE: Left digital diagnostic mammography was performed. The images were
evaluated with computer-aided detection. Standard and/or implant
displaced views were performed.

[L CC]
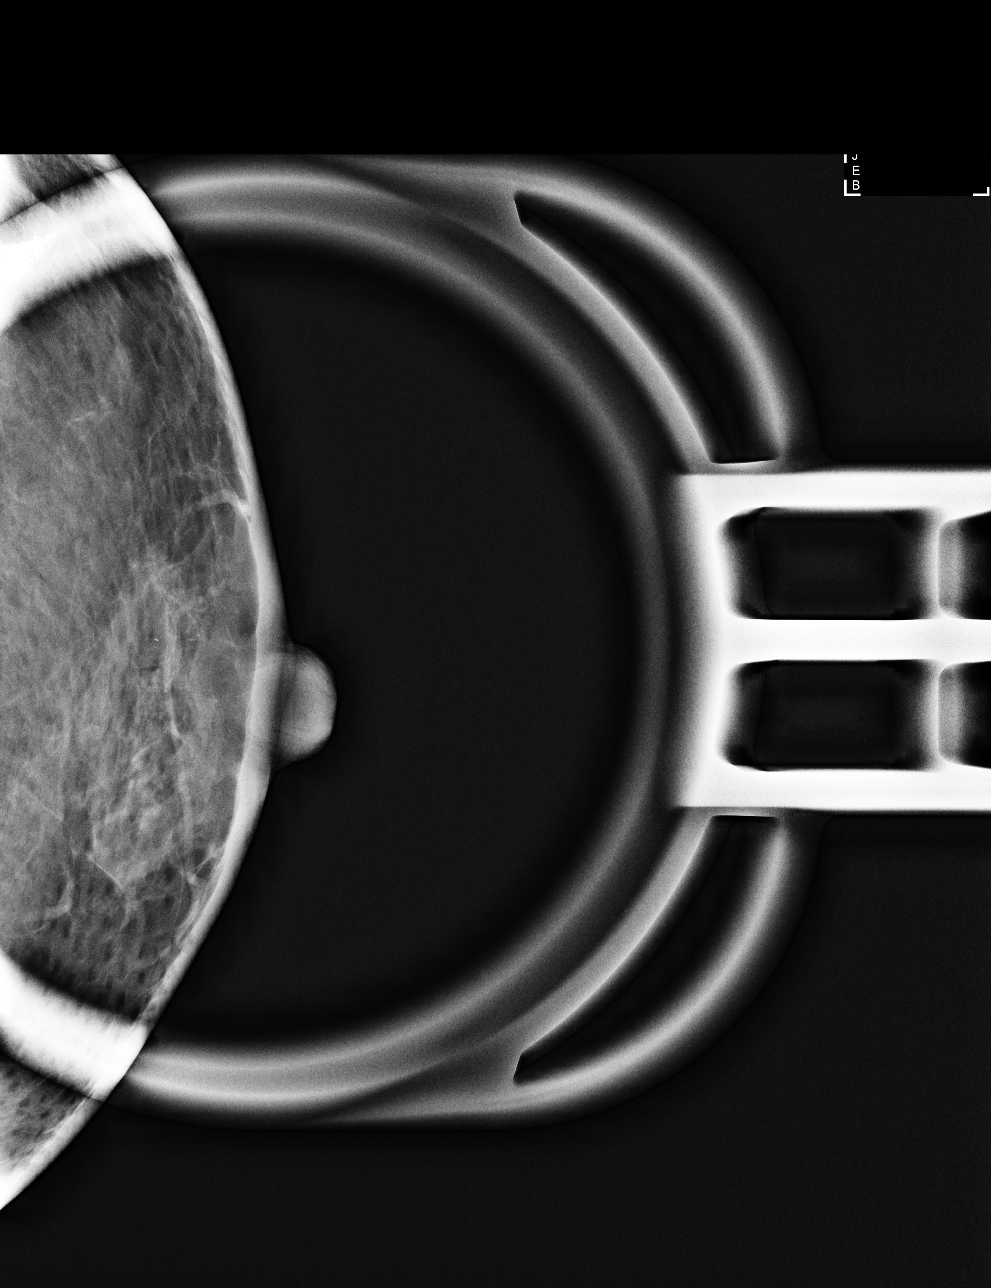

[L ML (1 of 2)]
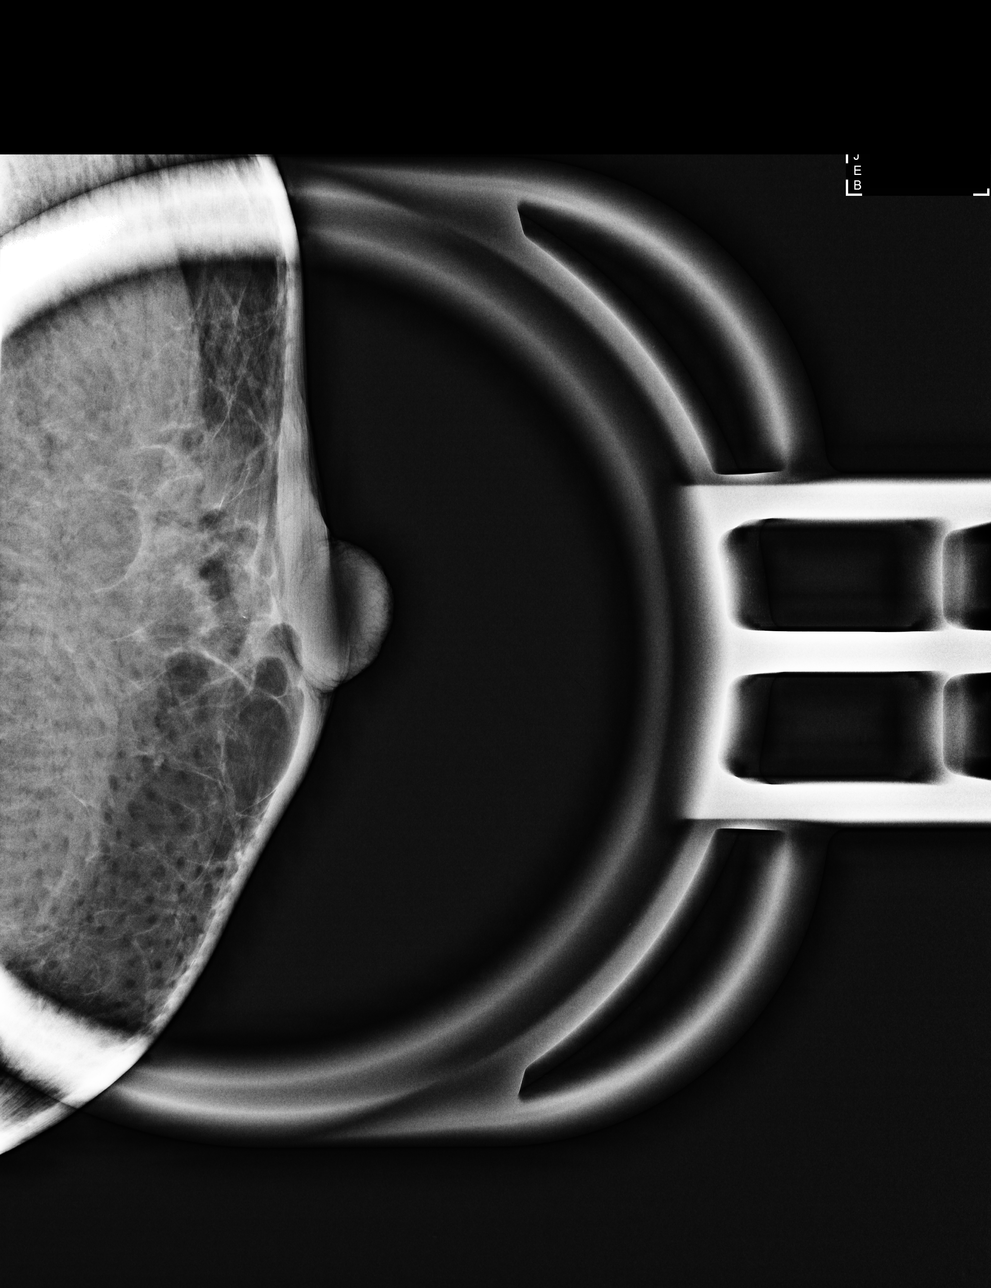

[L ML (2 of 2)]
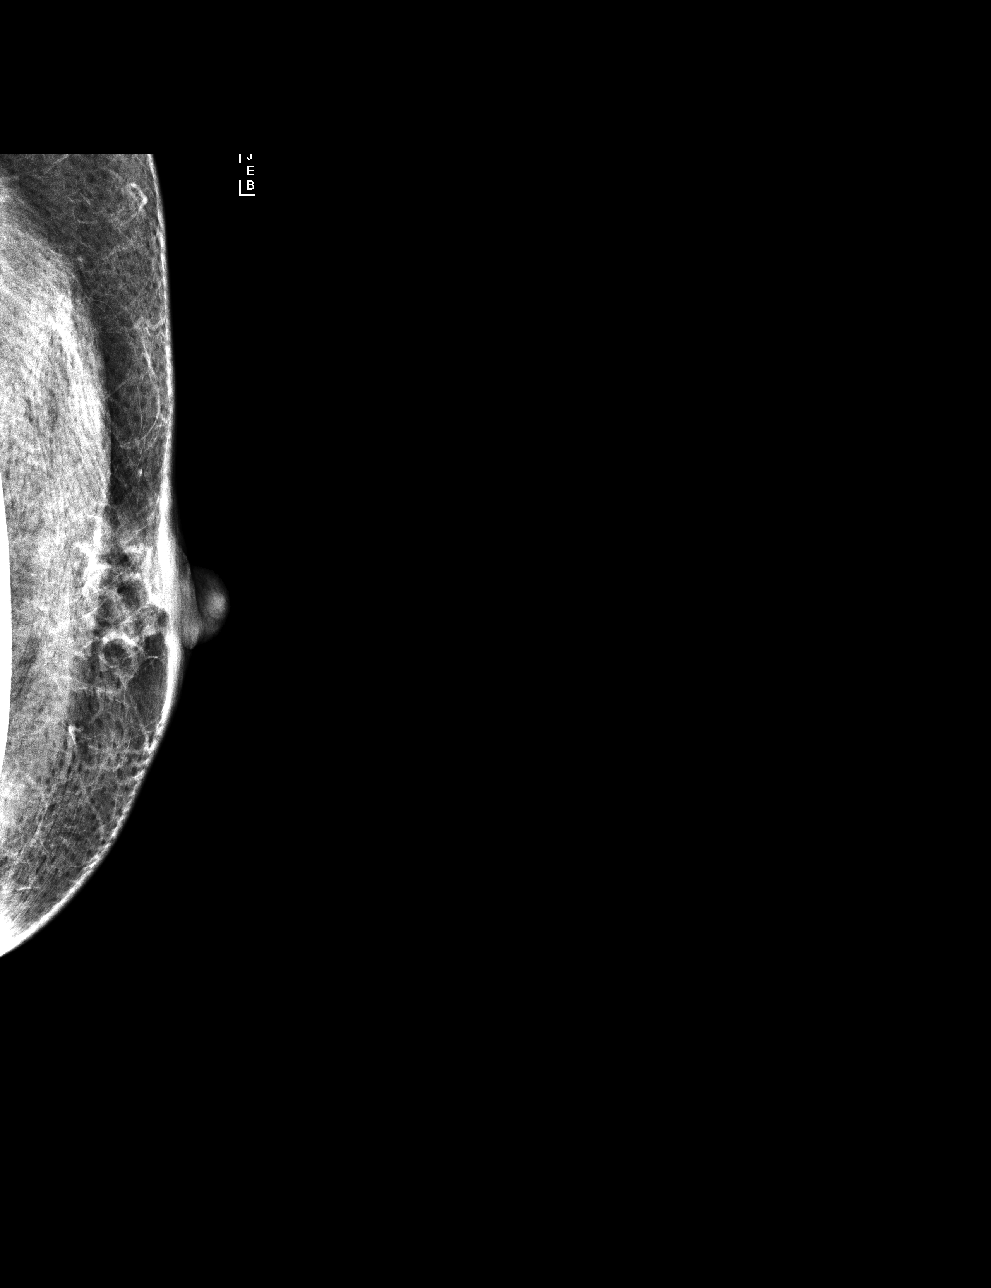

[3 of 3 positions shown; findings below may reference images not displayed]

ACR Breast Density Category b: There are scattered areas of
fibroglandular density.
FINDINGS: There are 2 new calcifications in the retroareolar region. The
larger of the 2 calcifications may be mildly linear. The other is
punctate. No other suspicious findings. The patient has
retropectoral implants.
IMPRESSION: Two new left breast calcification

RECOMMENDATION:
Recommend attempted stereotactic biopsy of the 2 new left breast
calcifications.

I have discussed the findings and recommendations with the patient.
If applicable, a reminder letter will be sent to the patient
regarding the next appointment.

BI-RADS CATEGORY  4: Suspicious.

ADDENDUM:
The patient presented for stereotactic core needle biopsy of the
left breast calcifications on 01/02/2022. Unfortunately due to lack
of adequate breast tissue the biopsies the calcifications were not
amenable to biopsy. There are 2 relatively low suspicion
calcifications. In discussion with the patient the options of
short-term follow-up and surgery were discussed. Patient agrees to
short-term follow-up.

Recommend diagnostic left breast mammogram in 6 months.

BI-RADS category: 3-probably benign.

*** End of Addendum ***
ACR Breast Density Category b: There are scattered areas of
fibroglandular density.
FINDINGS: There are 2 new calcifications in the retroareolar region. The
larger of the 2 calcifications may be mildly linear. The other is
punctate. No other suspicious findings. The patient has
retropectoral implants.
IMPRESSION: Two new left breast calcification

RECOMMENDATION:
Recommend attempted stereotactic biopsy of the 2 new left breast
calcifications.

I have discussed the findings and recommendations with the patient.
If applicable, a reminder letter will be sent to the patient
regarding the next appointment.

BI-RADS CATEGORY  4: Suspicious.

## 2023-09-16 DIAGNOSIS — Z23 Encounter for immunization: Secondary | ICD-10-CM | POA: Diagnosis not present

## 2023-09-24 DIAGNOSIS — H903 Sensorineural hearing loss, bilateral: Secondary | ICD-10-CM | POA: Diagnosis not present

## 2023-10-28 DIAGNOSIS — Z0000 Encounter for general adult medical examination without abnormal findings: Secondary | ICD-10-CM | POA: Diagnosis not present

## 2023-10-28 DIAGNOSIS — E559 Vitamin D deficiency, unspecified: Secondary | ICD-10-CM | POA: Diagnosis not present

## 2023-10-28 DIAGNOSIS — Z13220 Encounter for screening for lipoid disorders: Secondary | ICD-10-CM | POA: Diagnosis not present

## 2023-10-28 DIAGNOSIS — Z1329 Encounter for screening for other suspected endocrine disorder: Secondary | ICD-10-CM | POA: Diagnosis not present

## 2023-11-10 DIAGNOSIS — E7849 Other hyperlipidemia: Secondary | ICD-10-CM | POA: Diagnosis not present

## 2023-11-10 DIAGNOSIS — Z6820 Body mass index (BMI) 20.0-20.9, adult: Secondary | ICD-10-CM | POA: Diagnosis not present

## 2023-11-10 DIAGNOSIS — R921 Mammographic calcification found on diagnostic imaging of breast: Secondary | ICD-10-CM | POA: Diagnosis not present

## 2023-11-10 DIAGNOSIS — Z0001 Encounter for general adult medical examination with abnormal findings: Secondary | ICD-10-CM | POA: Diagnosis not present

## 2023-11-10 DIAGNOSIS — M8580 Other specified disorders of bone density and structure, unspecified site: Secondary | ICD-10-CM | POA: Diagnosis not present

## 2023-11-10 DIAGNOSIS — R030 Elevated blood-pressure reading, without diagnosis of hypertension: Secondary | ICD-10-CM | POA: Diagnosis not present

## 2023-11-29 ENCOUNTER — Other Ambulatory Visit: Payer: Self-pay | Admitting: Family Medicine

## 2023-11-29 DIAGNOSIS — R921 Mammographic calcification found on diagnostic imaging of breast: Secondary | ICD-10-CM

## 2023-11-30 ENCOUNTER — Encounter: Payer: Self-pay | Admitting: Family Medicine

## 2023-12-22 ENCOUNTER — Ambulatory Visit
Admission: RE | Admit: 2023-12-22 | Discharge: 2023-12-22 | Disposition: A | Discharge status: Home / Self Care | Payer: Medicare Other | Source: Ambulatory Visit | Attending: Family Medicine | Admitting: Family Medicine

## 2023-12-22 DIAGNOSIS — R921 Mammographic calcification found on diagnostic imaging of breast: Secondary | ICD-10-CM | POA: Diagnosis not present

## 2024-08-16 DIAGNOSIS — I781 Nevus, non-neoplastic: Secondary | ICD-10-CM | POA: Diagnosis not present

## 2024-08-16 DIAGNOSIS — D1801 Hemangioma of skin and subcutaneous tissue: Secondary | ICD-10-CM | POA: Diagnosis not present

## 2024-08-16 DIAGNOSIS — L814 Other melanin hyperpigmentation: Secondary | ICD-10-CM | POA: Diagnosis not present

## 2024-08-16 DIAGNOSIS — L570 Actinic keratosis: Secondary | ICD-10-CM | POA: Diagnosis not present

## 2024-09-29 DIAGNOSIS — Z23 Encounter for immunization: Secondary | ICD-10-CM | POA: Diagnosis not present

## 2024-11-03 DIAGNOSIS — Z1329 Encounter for screening for other suspected endocrine disorder: Secondary | ICD-10-CM | POA: Diagnosis not present

## 2024-11-03 DIAGNOSIS — Z13220 Encounter for screening for lipoid disorders: Secondary | ICD-10-CM | POA: Diagnosis not present

## 2024-11-03 DIAGNOSIS — R5383 Other fatigue: Secondary | ICD-10-CM | POA: Diagnosis not present

## 2024-11-03 DIAGNOSIS — E559 Vitamin D deficiency, unspecified: Secondary | ICD-10-CM | POA: Diagnosis not present

## 2024-11-10 DIAGNOSIS — M8580 Other specified disorders of bone density and structure, unspecified site: Secondary | ICD-10-CM | POA: Diagnosis not present

## 2024-11-10 DIAGNOSIS — I8312 Varicose veins of left lower extremity with inflammation: Secondary | ICD-10-CM | POA: Diagnosis not present

## 2024-11-10 DIAGNOSIS — E559 Vitamin D deficiency, unspecified: Secondary | ICD-10-CM | POA: Diagnosis not present

## 2024-11-10 DIAGNOSIS — Z0001 Encounter for general adult medical examination with abnormal findings: Secondary | ICD-10-CM | POA: Diagnosis not present

## 2024-11-10 DIAGNOSIS — Z6821 Body mass index (BMI) 21.0-21.9, adult: Secondary | ICD-10-CM | POA: Diagnosis not present

## 2024-11-20 ENCOUNTER — Other Ambulatory Visit: Payer: Self-pay | Admitting: Family Medicine

## 2024-11-20 DIAGNOSIS — Z1231 Encounter for screening mammogram for malignant neoplasm of breast: Secondary | ICD-10-CM

## 2024-12-01 DIAGNOSIS — H43813 Vitreous degeneration, bilateral: Secondary | ICD-10-CM | POA: Diagnosis not present

## 2024-12-25 ENCOUNTER — Ambulatory Visit
Admission: RE | Admit: 2024-12-25 | Discharge: 2024-12-25 | Disposition: A | Source: Ambulatory Visit | Attending: Family Medicine | Admitting: Family Medicine

## 2024-12-25 ENCOUNTER — Ambulatory Visit

## 2024-12-25 DIAGNOSIS — Z1231 Encounter for screening mammogram for malignant neoplasm of breast: Secondary | ICD-10-CM
